# Patient Record
Sex: Female | Born: 2000 | Race: White | Hispanic: No | Marital: Single | Smoking: Never smoker
Health system: Southern US, Community
[De-identification: ages and names within clinical notes are randomized; demographics above are authoritative.]

## PROBLEM LIST (undated history)

## (undated) DIAGNOSIS — Z8709 Personal history of other diseases of the respiratory system: Secondary | ICD-10-CM

## (undated) DIAGNOSIS — S62109A Fracture of unspecified carpal bone, unspecified wrist, initial encounter for closed fracture: Secondary | ICD-10-CM

## (undated) HISTORY — PX: TONSILLECTOMY: SUR1361

## (undated) HISTORY — PX: ADENOIDECTOMY: SUR15

---
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
